# Patient Record
Sex: Female | Born: 1980 | Race: Black or African American | Hispanic: No | Marital: Single | Smoking: Never smoker
Health system: Southern US, Community
[De-identification: ages and names within clinical notes are randomized; demographics above are authoritative.]

---

## 2003-11-03 ENCOUNTER — Emergency Department (HOSPITAL_COMMUNITY): Admission: EM | Admit: 2003-11-03 | Discharge: 2003-11-03 | Payer: Self-pay | Admitting: *Deleted

## 2004-06-04 ENCOUNTER — Emergency Department (HOSPITAL_COMMUNITY): Admission: EM | Admit: 2004-06-04 | Discharge: 2004-06-04 | Payer: Self-pay | Admitting: Emergency Medicine

## 2006-10-07 ENCOUNTER — Emergency Department (HOSPITAL_COMMUNITY): Admission: EM | Admit: 2006-10-07 | Discharge: 2006-10-07 | Payer: Self-pay | Admitting: Emergency Medicine
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
## 2008-02-05 ENCOUNTER — Encounter (INDEPENDENT_AMBULATORY_CARE_PROVIDER_SITE_OTHER): Payer: Self-pay | Admitting: Obstetrics and Gynecology

## 2008-02-05 ENCOUNTER — Inpatient Hospital Stay (HOSPITAL_COMMUNITY): Admission: AD | Admit: 2008-02-05 | Discharge: 2008-02-08 | Payer: Self-pay | Admitting: Obstetrics and Gynecology

## 2008-04-21 ENCOUNTER — Ambulatory Visit: Payer: Self-pay | Admitting: Oncology

## 2008-09-28 ENCOUNTER — Inpatient Hospital Stay (HOSPITAL_COMMUNITY): Admission: AD | Admit: 2008-09-28 | Discharge: 2008-09-28 | Payer: Self-pay | Admitting: Obstetrics & Gynecology

## 2009-05-01 ENCOUNTER — Inpatient Hospital Stay (HOSPITAL_COMMUNITY): Admission: AD | Admit: 2009-05-01 | Discharge: 2009-05-04 | Payer: Self-pay | Admitting: Obstetrics and Gynecology

## 2009-12-25 ENCOUNTER — Emergency Department (HOSPITAL_COMMUNITY): Admission: EM | Admit: 2009-12-25 | Discharge: 2009-12-25 | Payer: Self-pay | Admitting: Emergency Medicine

## 2009-12-26 ENCOUNTER — Emergency Department (HOSPITAL_BASED_OUTPATIENT_CLINIC_OR_DEPARTMENT_OTHER): Admission: EM | Admit: 2009-12-26 | Discharge: 2009-12-26 | Payer: Self-pay | Admitting: Emergency Medicine

## 2011-03-21 LAB — CBC
HCT: 31.7 % — ABNORMAL LOW (ref 36.0–46.0)
MCHC: 34.8 g/dL (ref 30.0–36.0)
Platelets: 211 10*3/uL (ref 150–400)
RBC: 3.52 MIL/uL — ABNORMAL LOW (ref 3.87–5.11)
RDW: 13.9 % (ref 11.5–15.5)
WBC: 9.9 10*3/uL (ref 4.0–10.5)

## 2011-03-21 LAB — RPR: RPR Ser Ql: NONREACTIVE

## 2011-04-25 NOTE — Discharge Summary (Signed)
Meredith Watts, Meredith Watts              ACCOUNT NO.:  [id number]   MEDICAL RECORD NO.:  [id number]          PATIENT TYPE:  INP   LOCATION:  9115                          FACILITY:  [facility]   PHYSICIAN:  Duke Salvia. Marcelle Overlie, M.D.DATE OF BIRTH:  10-02-1981   DATE OF ADMISSION:  05/01/2009  DATE OF DISCHARGE:  05/04/2009                               DISCHARGE SUMMARY   ADMITTING DIAGNOSES:  1. Intrauterine pregnancy at 71 weeks' estimated gestational age.  2. Previous cesarean section.  3. Spontaneous onset of labor with spontaneous rupture of membranes.   DISCHARGE DIAGNOSES:  1. Status post low transverse cesarean section.  2. A viable female infant.   PROCEDURE:  Repeat low transverse cesarean section.   REASON FOR ADMISSION:  Please see written H and P.   HOSPITAL COURSE:  The patient is 27-year gravida 2, para 1 that was  admitted to [facility] at 38-1/2 weeks' estimated  gestational age with spontaneous rupture of membranes in early labor.  The patient had had a previous cesarean delivery and desired repeat.  The patient did have a history of group B beta strep.  She was given IV  cefotetan preoperatively.  The patient was then transferred to the  operating room where spinal anesthesia was administered without  difficulty.  A low transverse incision was made with delivery of a  viable female infant, weighing 6 pounds 0 ounces with Apgars of 9 at 1-  minute and 9 at 5-minute.  Arterial cord pH is 7.32.  The patient  tolerated the procedure well and was taken to the recovery room in  stable condition.  On postoperative day #1, the patient was without  complaint.  Vital signs were stable.  She was afebrile.  Abdomen was  soft.  Fundus was firm and nontender.  Abdominal dressings noted to be  clean, dry, and intact.  Laboratory findings showed hemoglobin of 11.1.  On postoperative day #2, the patient was without complaint.  Vital signs  remained stable.  She is afebrile.   Fundus was firm and nontender.  Incision was clean, dry, and intact.  She was ambulating well.  On  postoperative day #3, the patient was without complaint.  Vital signs  were stable.  She was afebrile.  Fundus was firm and nontender.  Incision was clean, dry, and intact.  Staples were left in place, and  the patient was later discharged home.   CONDITION ON DISCHARGE:  Stable.   DIET:  Regular as tolerated.   ACTIVITY:  No heavy lifting, no driving x2 weeks, and no vaginal entry.   FOLLOW UP:  The patient is to follow up in the office in 1-2 weeks for  an incision check.  She is to call for temperature greater than 100  degrees, persistent nausea, vomiting, heavy vaginal bleeding and/or  redness or drainage from the incisional site.   DISCHARGE MEDICATIONS:  1. Tylox #30, 1 p.o. for 4-6 hours p.r.n.  2. Motrin 600 mg every 6 hours.  3. Prenatal vitamins 1 p.o. daily.      Julio Sicks, N.P.  Richard M. Marcelle Overlie, M.D.  Electronically Signed    CC/MEDQ  D:  05/04/2009  T:  05/04/2009  Job:  [id number]

## 2011-04-25 NOTE — Op Note (Signed)
Meredith Watts, Meredith Watts              ACCOUNT NO.:  [id number]   MEDICAL RECORD NO.:  [id number]          PATIENT TYPE:  INP   LOCATION:  9127                          [facility]ACILITY:  [facility]   PHYSICIAN:  Maxie Better, M.D.DATE OF BIRTH:  05/02/1981   DATE OF PROCEDURE:  02/05/2008  DATE OF DISCHARGE:                               OPERATIVE REPORT   PREOPERATIVE DIAGNOSIS:  Fetal intolerance to labor, term gestation.   POSTOPERATIVE DIAGNOSIS:  Fetal intolerance to labor, term gestation.   PROCEDURES:  Primary cesarean section Kerr hysterotomy.   ANESTHESIA:  Epidural.   SURGEON:  Maxie Better, M.D.   ASSISTANT:  Gerri Spore B. Earlene Plater, M.D.   INDICATIONS:  A 30 year old, gravida 1, para 0 female at term who  presented in early labor and was found to have some intermittent  decelerations. The patient at the time that she presented was  contracting every 7-10 minutes. The tracing otherwise was reassuring.  Her group B strep was positive.  The patient had artificial rupture of  membranes, clear fluid. An intrauterine pressure catheter was placed,  internal scalp electrode was placed as well. An amnioinfusion was  started although the patient at that time only had 3 lone variable  decelerations. The patient progressed to 4 cm during that interval of  time. Fetal tracing showed variable decelerations with contractions  resulting in the Pitocin being discontinued with a resultant tracing  that was reassuring and with resolutions of the variable decels. After  reassuring by the tracing for a period of a time, the Pitocin was  restarted due to the fact that the contractions were not adequate to  facilitate cervical dilatation.  The repetitive variable decelerations  which were deep were then noted and the Pitocin was only at 2  milliunits. Her cervical exam had not progressed beyond any other  process due to the contraction pattern. It was therefore felt that this  patient necessitated a  cesarean section due to the fact that she was  remote from delivery with deep variable decelerations. Surgical risk of  the procedure recommended was explained to the patient and her family  and consent was signed and the patient was transferred to the operating  room.   DESCRIPTION OF PROCEDURE:  Under adequate epidural anesthesia, the  patient was placed in the supine position with a  left lateral tilt.  She was sterilely prepped and draped in the usual fashion.  An  indwelling Foley catheter was already in place. A Pfannenstiel skin  incision was made, carried down to the rectus fascia.  The rectus fascia  was opened transversely.  The rectus fascia was then bluntly and sharply  dissected off the rest the rectus muscle in superior and inferior  fashion.  The rectus muscle was split in the midline, the  parietoperitoneum was entered  bluntly and extended. On entering the  pelvis, the was uterus was small for term size. A lower uterine segment  that was limited. The vesicouterine peritoneum was opened transversely,  the bladder was displaced inferiorly and a curvilinear low transverse  uterine incision was then made and extended carefully with  bandage  scissors. Subsequent delivery of a live female from a direct occiput  posterior presentation was accomplished.  The cord was around the neck  x2 as well as around the body x1. The cord was then raveled, clamped. He  was transferred to the awaiting pediatrician who assigned Apgar's of 8  and 9 at 1 and 5 minutes. The placenta quickly came up in the field  after the delivery, a question of abruption was made with some old clots  noted and the placenta was sent to pathology.  The uterine cavity was  cleaned of debris. The uterine incision on inspection had extension on  the right side extending downwards. The uterine incision was closed with  a single layer of closure of #0 Monocryl running locked stitch.  Bleeding was encountered in the  right lower angle uterus, it was  therefore exteriorized to try to facilitate the bleeding. An O'Leary  suture was placed on the right. When hemostasis was felt to have been  achieved on the right side, the uterus was returned to the abdomen,  however, on subsequent inspection, the bleeding was just as brisk. The  uterus was once again exteriorized.  This was continued until the  bleeding was subsequently controlled with additional sutures being  placed and a second layer is also being placed as well.  The abdomen was  copiously irrigated, small bleeders cauterized.  Normal tubes and  ovaries were noted bilaterally.  The patient had the paracolic gutters  cleaned. The hemostasis was well in the uterus and a decision was made  not to put a second closure due to the location of the extension on the  right side. The parietoperitoneum was not closed.  The rectus fascia was  closed with #0 Vicryl x2.  The subcutaneous area was  irrigated, small  bleeders cauterized and interrupted 2-0 plain sutures was placed and the  skin approximated using Ethicon staples. The specimens were sent to  pathology, question abruption.  Estimated blood loss was 1 liter, urine  output was 1300 mL.  Intraoperative fluid was 2700 mL crystalloid.  Sponge and instrument counts x2 was correct.  The weight of the baby was  5 pounds 3 ounces.  The patient tolerated the procedure well and was  transferred to the recovery room in stable condition.      Maxie Better, M.D.  Electronically Signed     [place]/MEDQ  D:  02/05/2008  T:  02/06/2008  Job:  [id number]

## 2011-04-25 NOTE — Op Note (Signed)
Meredith Watts, Meredith Watts              ACCOUNT NO.:  [id number]   MEDICAL RECORD NO.:  [id number]          PATIENT TYPE:  INP   LOCATION:  9115                          [facility]ACILITY:  [facility]   PHYSICIAN:  Dineen Kid. Rana Snare, M.D.    DATE OF BIRTH:  [birth year]   DATE OF PROCEDURE:  05/01/2009  DATE OF DISCHARGE:                               OPERATIVE REPORT   PREOPERATIVE DIAGNOSES:  Intrauterine pregnancy at 38-1/2 weeks'  gestational age, previous cesarean section with desired repeat, labor  with rupture of membranes.   POSTOPERATIVE DIAGNOSES:  Intrauterine pregnancy at 38-1/2 weeks'  gestational age, previous cesarean section with desired repeat, labor  with rupture of membranes.   PROCEDURE:  Repeat low segment transverse cesarean section.   SURGEON:  Dineen Kid. Rana Snare, MD   ANESTHESIA:  Spinal.   INDICATION:  [name] is a 30 year old, G2, P1, at 38-1/2 gestational  weeks, who presents with spontaneous rupture of membranes and early  labor.  She desires repeat cesarean section.  Risks and benefits were  discussed.  Informed consent was obtained.  History of group B strep  positive.  The patient was given IV cefotetan preoperatively.   DESCRIPTION OF PROCEDURE:  After adequate analgesia, the patient was  placed in the supine position with left lateral tilt.  She was sterilely  prepped and draped.  Bladder sterilely drained with a Foley catheter.  A  Pfannenstiel skin incision was made.  Incision was taken down sharply to  the midline which was incised transversely, extended superiorly and  inferiorly off the bellies, the rectus muscle were separated sharply in  midline.  Peritoneum was entered sharply.  Bladder flap created and  placed behind the bladder blade.  A low segment myotomy incision was  made down the amniotic sac, extended laterally with the operator's  fingertips.  The infant's vertex was delivered atraumatically.  The  nares and pharynx were then suctioned.  Infant delivered,  cord clamped,  cut and handed to pediatrician for resuscitation.  Cord blood was  obtained.  Placenta extracted manually.  The uterus was exteriorized,  wiped clean with a dry lap.  The myotomy incision was closed in 2  layers, first with a running locking layer and second with imbricating  layer of 0 Monocryl suture.  Uterus was placed back into peritoneal  cavity after copious amount of irrigation and adequate hemostasis was  assured.  Peritoneum was closed.  The rectus muscle was plicated in  midline with 0 Monocryl suture.  Irrigation applied and after adequate  hemostasis, the fascia was closed with #1 Vicryl in running fashion.  Irrigation applied and after adequate hemostasis, skin stapled, Steri-  Strips applied.  The  patient tolerated procedure well, stable on transfer to recovery room.  Sponge and instrument count was normal x3.  Estimated blood loss 650 mL.  Apgars were 9 and 9, pH arterial 7.32, a viable female infant.  Placenta  sent to L and D.  The patient received 1 g of cefotetan preoperatively.      Dineen Kid Rana Snare, M.D.  Electronically Signed     [facility]/MEDQ  D:  05/01/2009  T:  05/01/2009  Job:  [id number]

## 2011-04-28 NOTE — Discharge Summary (Signed)
Meredith Watts, Meredith Watts              ACCOUNT NO.:  [id number]   MEDICAL RECORD NO.:  [id number]          PATIENT TYPE:  INP   LOCATION:  9127                          [facility]   PHYSICIAN:  Maxie Better, M.D.DATE OF BIRTH:  06/17/1981   DATE OF ADMISSION:  02/05/2008  DATE OF DISCHARGE:  02/08/2008                               DISCHARGE SUMMARY   ADMISSION DIAGNOSES:  Early labor, term gestation, group B streptococcus  positive.   DISCHARGE DIAGNOSES:  Term gestation delivered, fetal intolerance to  labor, postoperative anemia.   PROCEDURE:  Primary cesarean section, Kerr hysterotomy.   HISTORY OF PRESENT ILLNESS:  A 26-year gravida 1, para 0 female at term  presented with early labor.  Her prenatal course had been uncomplicated.   HOSPITAL COURSE:  On admission, the patient had a reactive tracing with  some variable and irregular contractions.  Her cervical exam was 180-90%  minus 1.  Group B strep was positive.  She was started on antibiotics  prophylaxis.  Dose of Pitocin was started.  The patient had artificial  rupture of membranes clear fluid noted.  Due to the variable  deceleration, amnioinfusion was started.  The patient subsequently had a  rather tachy systolic associated with variable decelerated durations  down to the 70s for 60 seconds each over about 5 contractions.  Pitocin  had been discontinued.  Decelerations subsequently resolved.  Contractions were inadequate for cervical dilatation.  The patient was 4  cm per the R.N.  The variable decelerations continued.  Decision was  then made to proceed with a primary cesarean section.  Primary cesarean  section resulted in delivery of a live female occiput posterior  presentation 5 pounds 3 ounces, Apgar's of eight and nine.  The patient  subsequently had an uncomplicated postoperative course.  Her CBC on  postop day #1 showed a hemoglobin 9.7, hematocrit 28.3, white count of  17.9.  On postop day #3, the  patient was tolerating a regular diet and  no evidence of infection, and was deemed well to be discharged home.   DISPOSITION:  Home.   CONDITION:  Stable.   DISCHARGE MEDICATIONS:  1. Darvocet 1 tablets every 4-6 hours p.r.n. pain.  2. Motrin 600-800 mg 1 p.o. daily hours p.r.n. pain.  3. Prenatal vitamins 1 p.o. daily.  4. Slow FE 1 p.o. daily.   FOLLOWUP APPOINTMENT:  At [facility] in 6 weeks.   DISCHARGE INSTRUCTIONS:  Per the postpartum booklet given.      Maxie Better, M.D.  Electronically Signed     [place]/MEDQ  D:  03/19/2008  T:  03/19/2008  Job:  [id number]

## 2011-09-01 LAB — CBC
HCT: 33.9 — ABNORMAL LOW
Hemoglobin: 11.8 — ABNORMAL LOW
Hemoglobin: 9.7 — ABNORMAL LOW
MCHC: 34.1
MCV: 98.9
RBC: 2.87 — ABNORMAL LOW
WBC: 12.5 — ABNORMAL HIGH

## 2011-09-11 LAB — URINALYSIS, ROUTINE W REFLEX MICROSCOPIC
Glucose, UA: NEGATIVE
Hgb urine dipstick: NEGATIVE
Ketones, ur: NEGATIVE
Protein, ur: NEGATIVE

## 2011-09-11 LAB — POCT PREGNANCY, URINE: Preg Test, Ur: POSITIVE

## 2014-06-18 ENCOUNTER — Encounter (HOSPITAL_COMMUNITY): Payer: Self-pay | Admitting: Emergency Medicine

## 2014-06-18 ENCOUNTER — Emergency Department (HOSPITAL_COMMUNITY)
Admission: EM | Admit: 2014-06-18 | Discharge: 2014-06-18 | Disposition: A | Discharge status: Home / Self Care | Payer: BC Managed Care – PPO | Attending: Emergency Medicine | Admitting: Emergency Medicine

## 2014-06-18 ENCOUNTER — Emergency Department (HOSPITAL_COMMUNITY): Payer: BC Managed Care – PPO

## 2014-06-18 DIAGNOSIS — M545 Low back pain, unspecified: Secondary | ICD-10-CM

## 2014-06-18 DIAGNOSIS — Y9389 Activity, other specified: Secondary | ICD-10-CM | POA: Insufficient documentation

## 2014-06-18 DIAGNOSIS — Y92410 Unspecified street and highway as the place of occurrence of the external cause: Secondary | ICD-10-CM | POA: Diagnosis not present

## 2014-06-18 DIAGNOSIS — Z3202 Encounter for pregnancy test, result negative: Secondary | ICD-10-CM | POA: Diagnosis not present

## 2014-06-18 DIAGNOSIS — IMO0002 Reserved for concepts with insufficient information to code with codable children: Secondary | ICD-10-CM | POA: Insufficient documentation

## 2014-06-18 LAB — URINALYSIS, ROUTINE W REFLEX MICROSCOPIC
Bilirubin Urine: NEGATIVE
GLUCOSE, UA: NEGATIVE mg/dL
Hgb urine dipstick: NEGATIVE
KETONES UR: NEGATIVE mg/dL
LEUKOCYTES UA: NEGATIVE
Nitrite: NEGATIVE
PROTEIN: NEGATIVE mg/dL
Specific Gravity, Urine: 1.023 (ref 1.005–1.030)
UROBILINOGEN UA: 1 mg/dL (ref 0.0–1.0)
pH: 6 (ref 5.0–8.0)

## 2014-06-18 LAB — PREGNANCY, URINE: Preg Test, Ur: NEGATIVE

## 2014-06-18 MED ORDER — IBUPROFEN 800 MG PO TABS
800.0000 mg | ORAL_TABLET | Freq: Once | ORAL | Status: AC
  Administered 2014-06-18: 800 mg via ORAL
  Filled 2014-06-18: qty 1
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
## 2014-06-18 NOTE — ED Notes (Signed)
Pt ambulatory with steady gait to exit.
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
## 2014-06-21 NOTE — ED Provider Notes (Signed)
Medical screening examination/treatment/procedure(s) were performed by non-physician practitioner and as supervising physician I was immediately available for consultation/collaboration.   Candyce ChurnJohn David Natalyia Innes III, MD 06/21/14 2041
